# Patient Record
Sex: Male | Born: 1990 | Race: Black or African American | Hispanic: No | Marital: Single | Smoking: Current every day smoker
Health system: Southern US, Community
[De-identification: ages and names within clinical notes are randomized; demographics above are authoritative.]

---

## 2016-10-31 ENCOUNTER — Emergency Department (HOSPITAL_COMMUNITY)
Admission: EM | Admit: 2016-10-31 | Discharge: 2016-10-31 | Disposition: A | Discharge status: Home / Self Care | Payer: No Typology Code available for payment source | Attending: Emergency Medicine | Admitting: Emergency Medicine

## 2016-10-31 ENCOUNTER — Encounter (HOSPITAL_COMMUNITY): Payer: Self-pay | Admitting: Emergency Medicine

## 2016-10-31 ENCOUNTER — Emergency Department (HOSPITAL_COMMUNITY): Payer: No Typology Code available for payment source

## 2016-10-31 DIAGNOSIS — F17210 Nicotine dependence, cigarettes, uncomplicated: Secondary | ICD-10-CM | POA: Insufficient documentation

## 2016-10-31 DIAGNOSIS — M25512 Pain in left shoulder: Secondary | ICD-10-CM | POA: Insufficient documentation

## 2016-10-31 DIAGNOSIS — R0781 Pleurodynia: Secondary | ICD-10-CM | POA: Insufficient documentation

## 2016-10-31 MED ORDER — ACETAMINOPHEN 500 MG PO TABS: 500.0000 mg | ORAL_TABLET | Freq: Four times a day (QID) | ORAL | 0 refills | Status: AC | PRN

## 2016-10-31 MED ORDER — ACETAMINOPHEN 325 MG PO TABS
650.0000 mg | ORAL_TABLET | Freq: Once | ORAL | Status: AC
  Administered 2016-10-31: 650 mg via ORAL
  Filled 2016-10-31: qty 2
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
